# Patient Record
Sex: Female | Born: 1990 | Race: Black or African American | Hispanic: No | Marital: Single | Smoking: Current every day smoker
Health system: Southern US, Community
[De-identification: ages and names within clinical notes are randomized; demographics above are authoritative.]

---
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
## 2012-03-20 ENCOUNTER — Emergency Department: Payer: Self-pay | Admitting: Emergency Medicine

## 2012-04-10 IMAGING — US US OB < 14 WEEKS
1 series · 17 of 28 positions shown · non-contrast
Comparison: none

REASON FOR EXAM: 12.3 weeks gestation spotting/pain
COMMENTS:

[Series 1: us ob < 14 weeks · 17 of 37 slices shown]
[im 1/37]
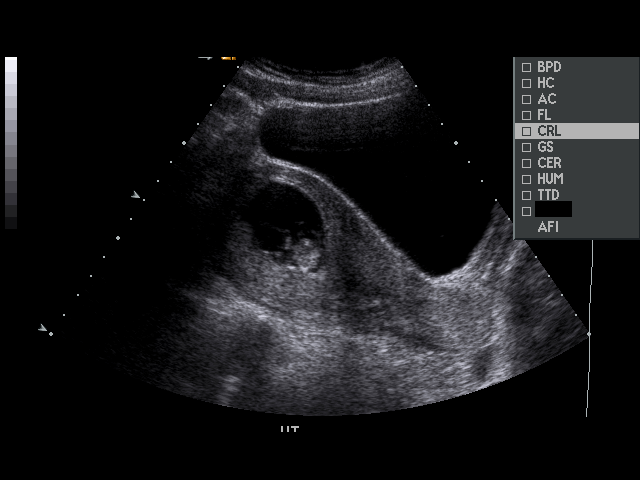
[im 3/37]
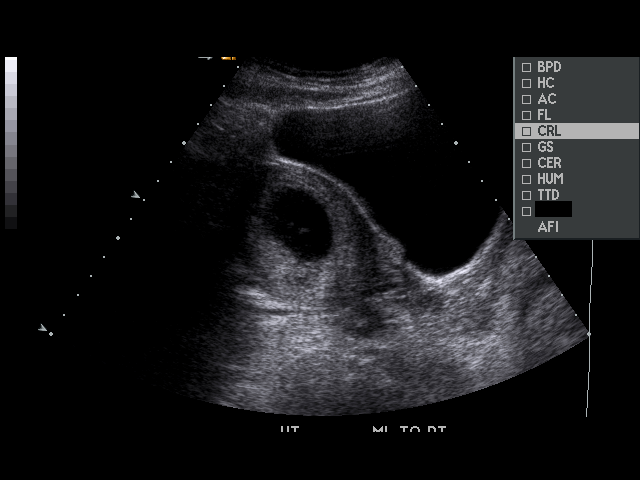
[im 6/37]
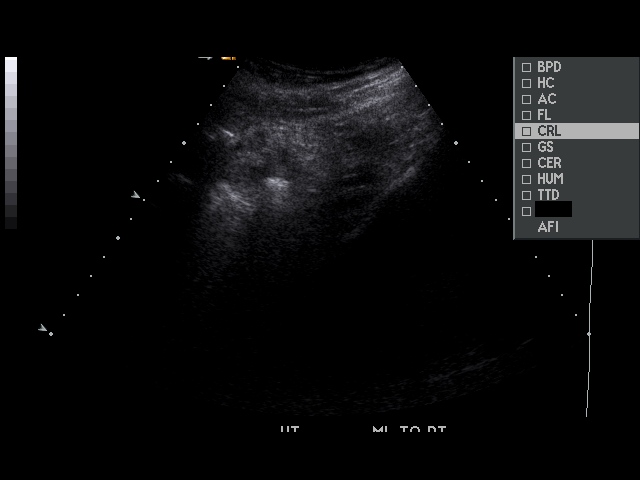
[im 7/37]
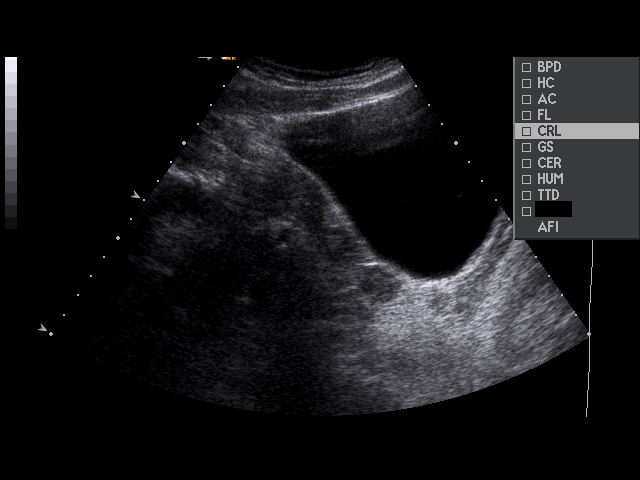
[im 10/37]
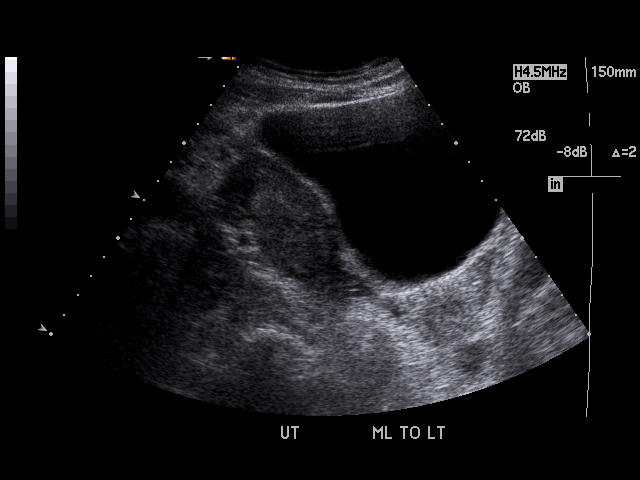
[im 13/37]
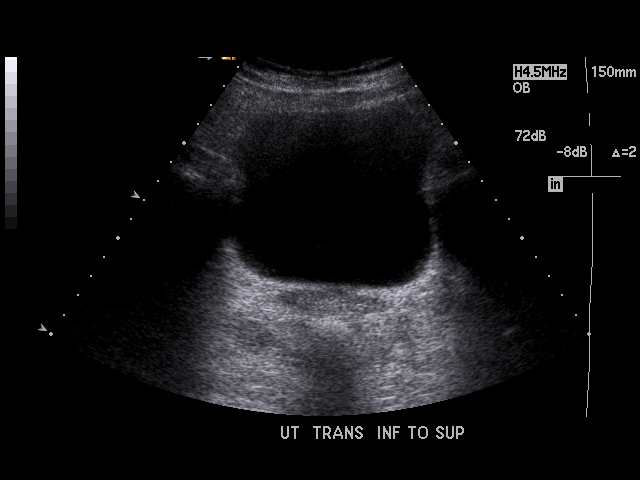
[im 14/37]
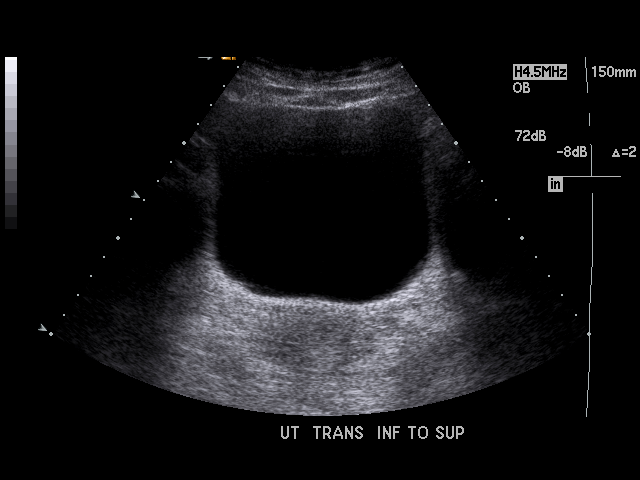
[im 17/37]
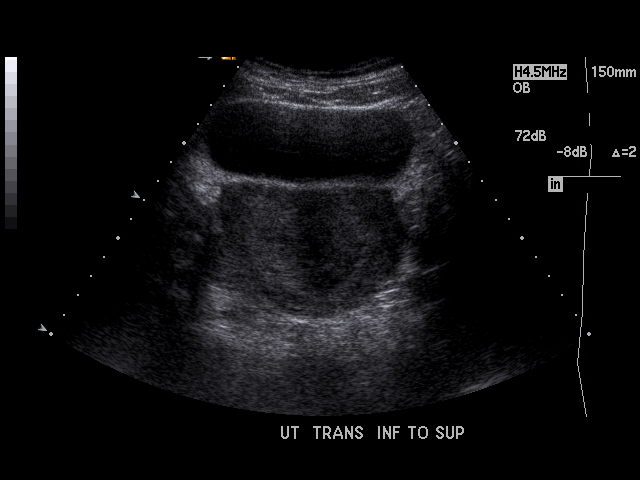
[im 19/37]
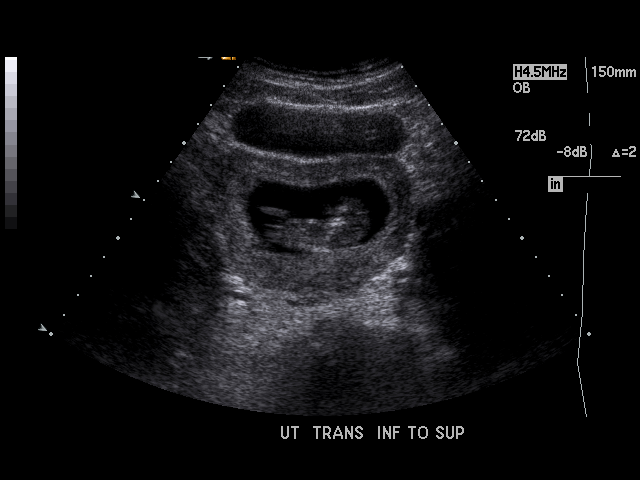
[im 21/37]
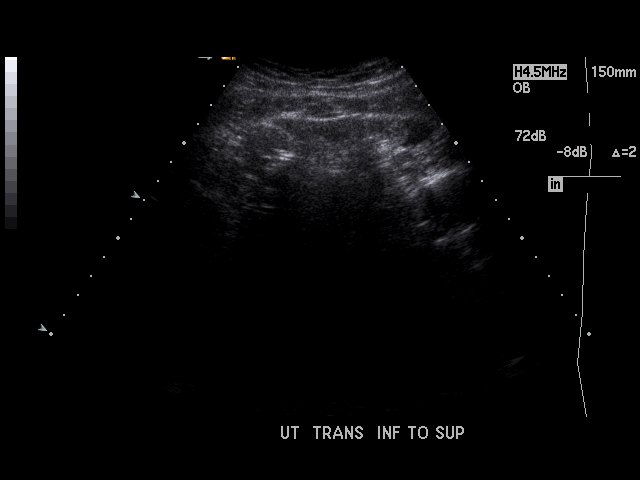
[im 23/37]
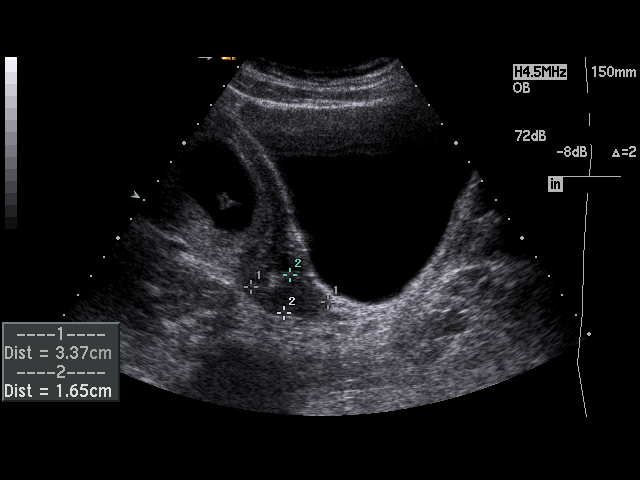
[im 25/37]
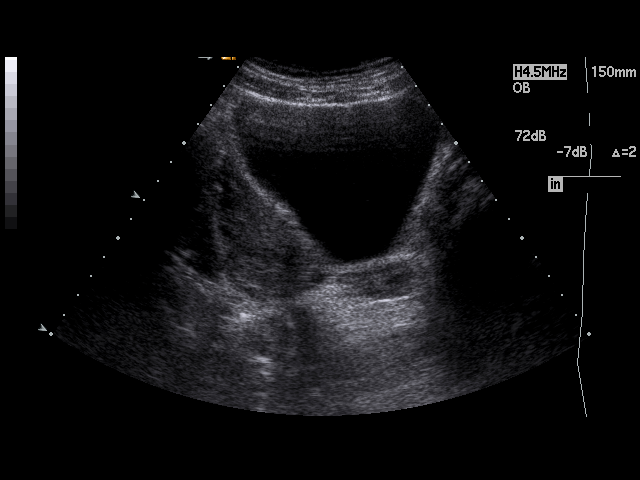
[im 27/37]
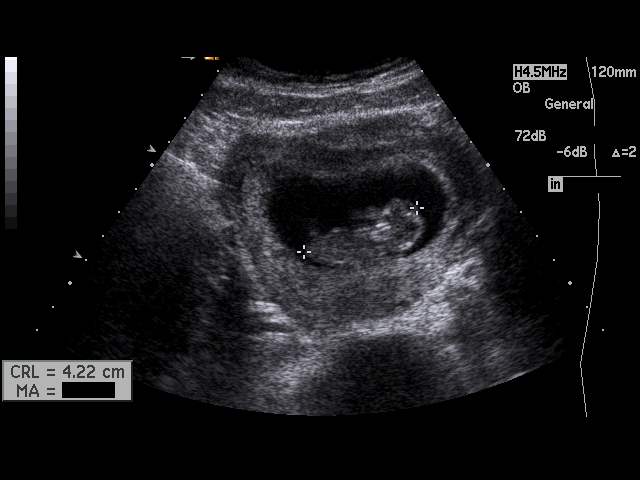
[im 30/37]
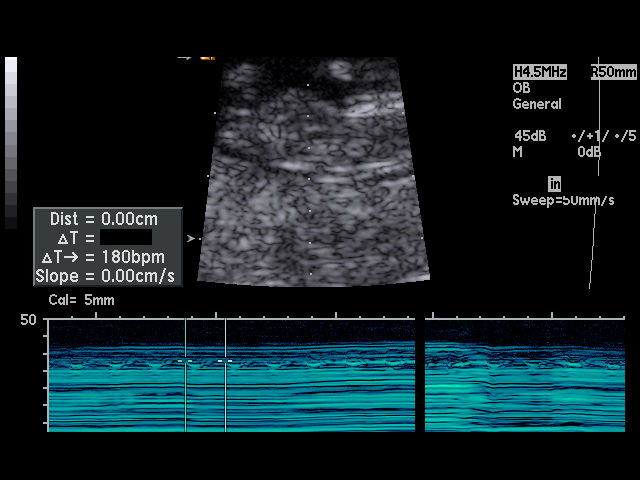
[im 31/37]
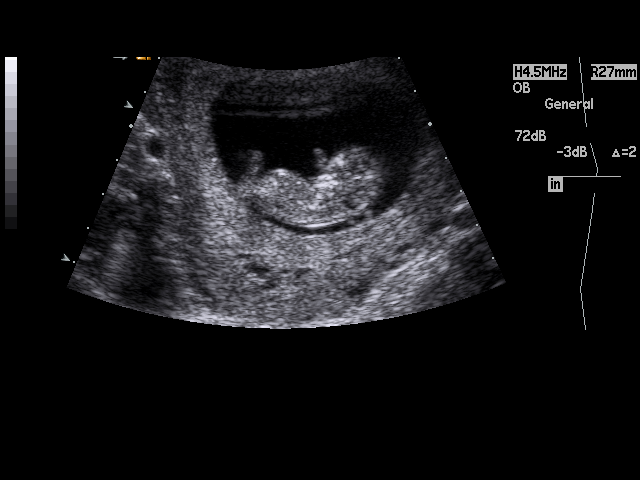
[im 34/37]
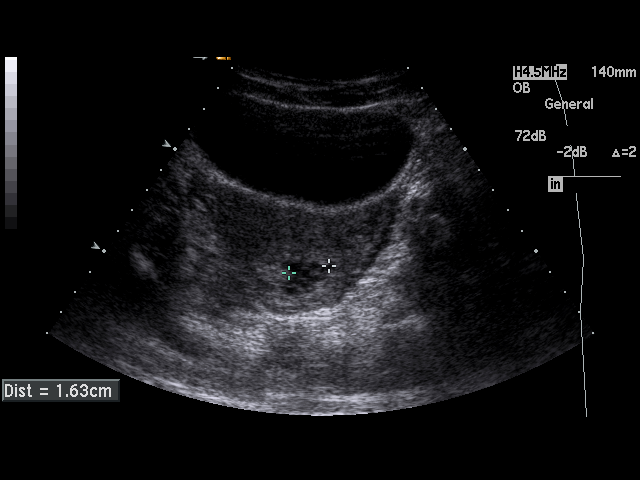
[im 37/37]
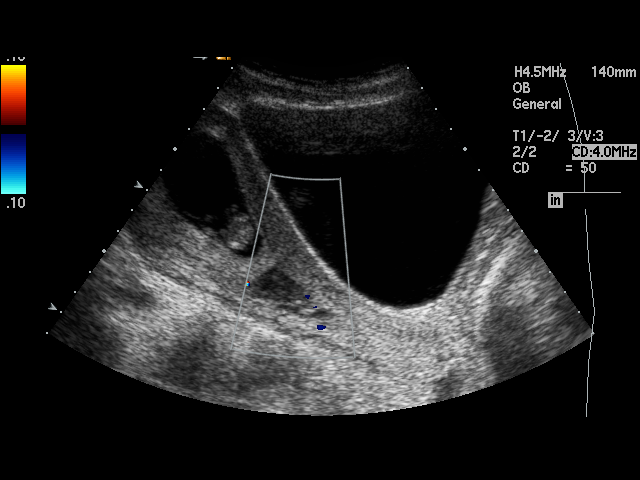

[17 of 28 positions shown; findings below may reference images not displayed]

PROCEDURE:     US  - US OB LESS THAN 14 WEEKS  - May 11, 2011  [DATE]

RESULT:     There is observed a single living intrauterine gestation. Fetal
heart rate was monitored at 184 beats per minute. There is noted a small
subchorionic bleed inferiorly. The bleed site measures approximately 2.73 cm
x 1.45 cm x 1.63 cm. No other significant abnormalities are observed. The
crown rump length measures 4.24 cm which corresponds to an estimated
menstrual age of 11 weeks 0 days. Ultrasound EDD is November 30, 2011.

The maternal ovaries are visualized bilaterally and are normal in
appearance. No abnormal adnexal masses are seen. The visualized portion of
the maternal urinary bladder is normal in appearance. No free fluid is seen
in the maternal pelvis.
IMPRESSION: 1. There is a living intrauterine gestation of approximately 11 weeks 0 days
menstrual age.
2. Ultrasound EDD is November 30, 2011.
3. There is observed a small subchorionic bleed inferiorly as noted above.

## 2012-09-28 ENCOUNTER — Emergency Department: Payer: Self-pay | Admitting: Emergency Medicine

## 2012-09-28 LAB — URINALYSIS, COMPLETE
Bacteria: NONE SEEN
Blood: NEGATIVE
Glucose,UR: NEGATIVE mg/dL (ref 0–75)
Ketone: NEGATIVE
Nitrite: NEGATIVE
Ph: 7 (ref 4.5–8.0)
Protein: NEGATIVE
RBC,UR: 4 /HPF (ref 0–5)
Specific Gravity: 1.024 (ref 1.003–1.030)
Squamous Epithelial: 11
WBC UR: 11 /HPF (ref 0–5)

## 2012-09-28 LAB — PREGNANCY, URINE: Pregnancy Test, Urine: NEGATIVE m[IU]/mL

## 2012-09-28 LAB — COMPREHENSIVE METABOLIC PANEL
Alkaline Phosphatase: 115 U/L (ref 82–169)
Anion Gap: 7 (ref 7–16)
BUN: 8 mg/dL (ref 7–18)
Co2: 24 mmol/L (ref 21–32)
Creatinine: 0.67 mg/dL (ref 0.60–1.30)
EGFR (African American): >60
EGFR (Non-African Amer.): >60
Osmolality: 275 (ref 275–301)
Potassium: 3.9 mmol/L (ref 3.5–5.1)
SGPT (ALT): 24 U/L (ref 12–78)
Total Protein: 7.9 g/dL (ref 6.4–8.6)

## 2012-09-28 LAB — CBC
HCT: 41.4 % (ref 35.0–47.0)
MCH: 31.6 pg (ref 26.0–34.0)
MCHC: 33.5 g/dL (ref 32.0–36.0)
Platelet: 300 10*3/uL (ref 150–440)
RDW: 13.7 % (ref 11.5–14.5)
WBC: 15.7 10*3/uL — ABNORMAL HIGH (ref 3.6–11.0)

## 2012-12-10 ENCOUNTER — Emergency Department: Payer: Self-pay | Admitting: Internal Medicine

## 2012-12-10 LAB — CBC
HCT: 42.2 % (ref 35.0–47.0)
MCH: 31.2 pg (ref 26.0–34.0)
MCHC: 33.6 g/dL (ref 32.0–36.0)
MCV: 93 fL (ref 80–100)
Platelet: 300 10*3/uL (ref 150–440)
RBC: 4.54 10*6/uL (ref 3.80–5.20)
WBC: 13.1 10*3/uL — ABNORMAL HIGH (ref 3.6–11.0)

## 2012-12-10 LAB — COMPREHENSIVE METABOLIC PANEL
Albumin: 4.1 g/dL (ref 3.8–5.6)
Anion Gap: 7 (ref 7–16)
Bilirubin,Total: 0.3 mg/dL (ref 0.2–1.0)
Co2: 22 mmol/L (ref 21–32)
Creatinine: 0.63 mg/dL (ref 0.60–1.30)
EGFR (African American): >60
EGFR (Non-African Amer.): >60
Osmolality: 274 (ref 275–301)
Potassium: 3.7 mmol/L (ref 3.5–5.1)
SGOT(AST): 21 U/L (ref 0–26)
SGPT (ALT): 22 U/L (ref 12–78)
Total Protein: 7.7 g/dL (ref 6.4–8.6)

## 2012-12-10 LAB — URINALYSIS, COMPLETE
Bilirubin,UR: NEGATIVE
RBC,UR: 2 /HPF (ref 0–5)
Squamous Epithelial: 25
WBC UR: 7 /HPF (ref 0–5)

## 2013-08-29 IMAGING — US ABDOMEN ULTRASOUND LIMITED
1 series · 14 of 25 positions shown · non-contrast
Comparison: none

REASON FOR EXAM: epig abd pain, intermittent
COMMENTS:   Body Site: GB and Fossa, CBD, Head of Pancreas

PROCEDURE:     US  - US ABDOMEN LIMITED SURVEY  - September 28, 2012  [DATE]
RESULT:     Right upper quadrant abdominal ultrasound dated 09/28/2012
TECHNIQUE: Real-time sonographic imaging of the right quadrant was obtained.
Representative static imaging was provided for interpretation.

[Series 1: abdomen ultrasound limited · 0.23mm/px · 14 of 50 slices shown]
[im 1/50]
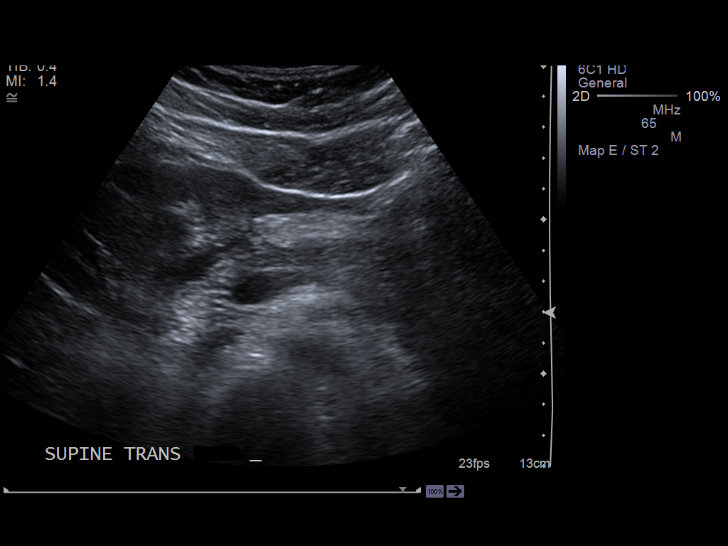
[im 5/50]
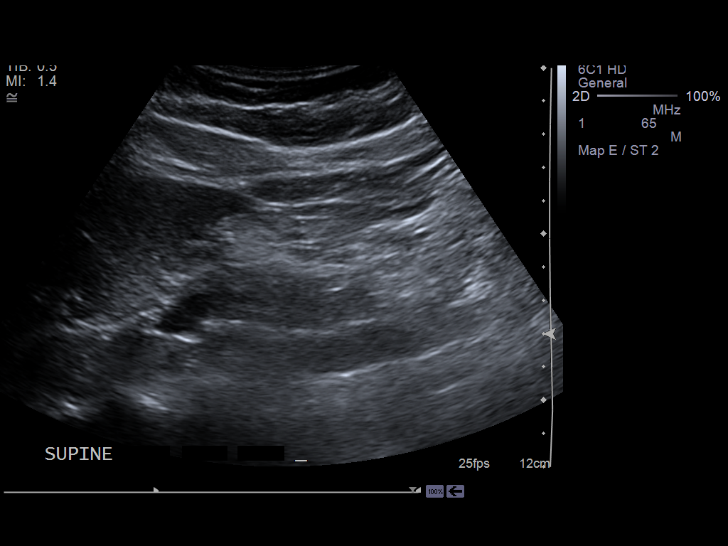
[im 9/50]
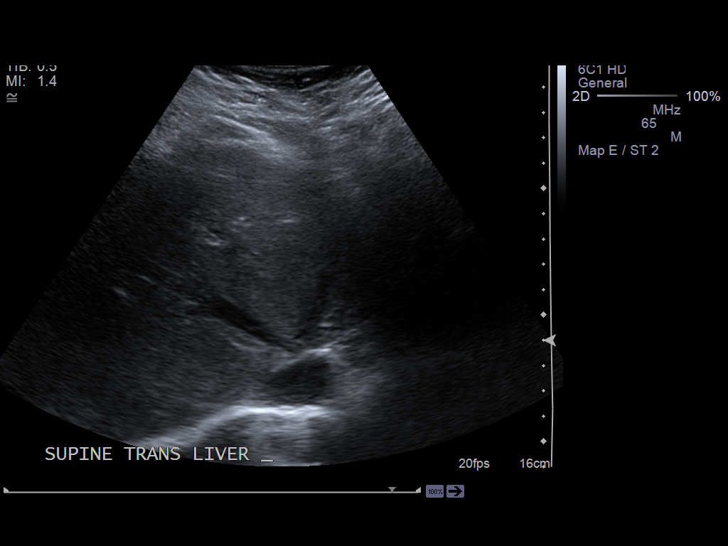
[im 13/50]
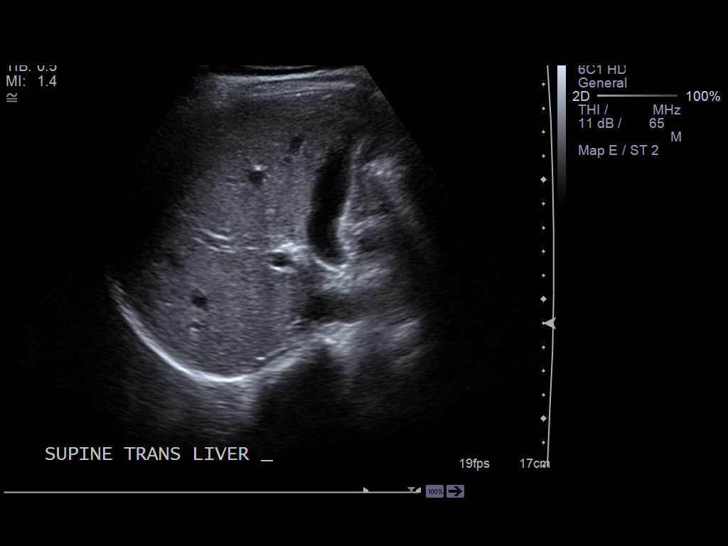
[im 17/50]
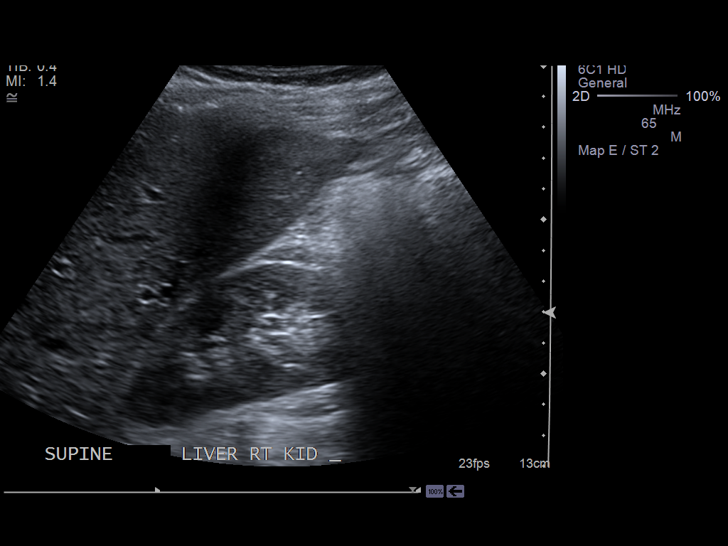
[im 19/50]
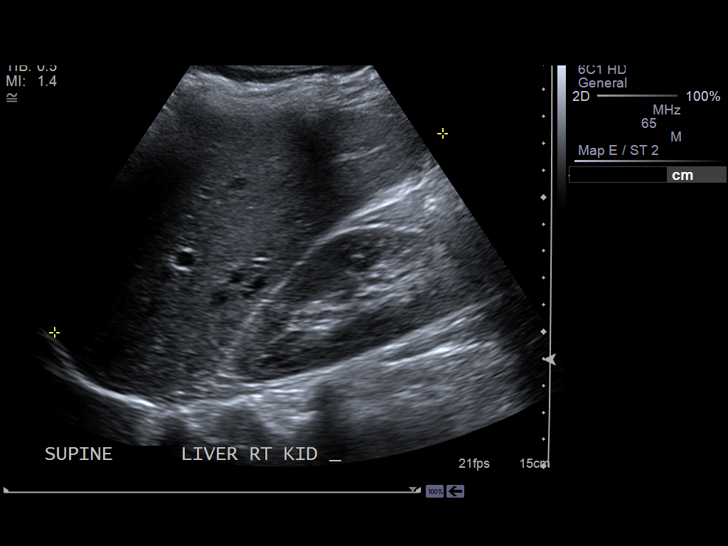
[im 23/50]
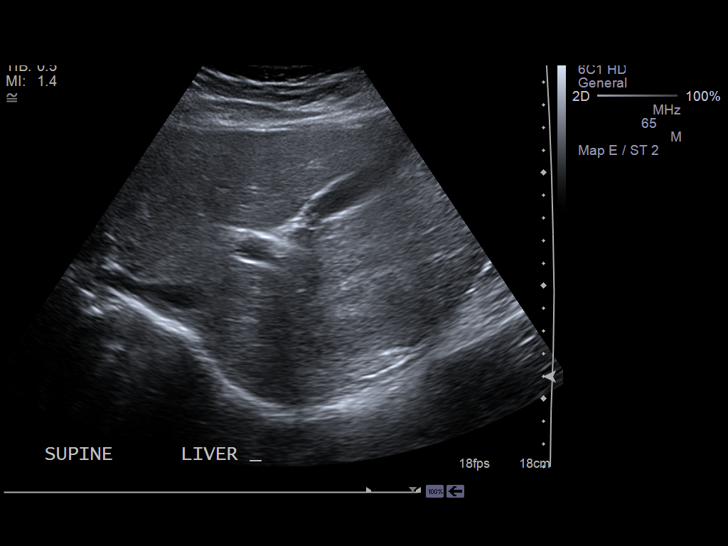
[im 27/50]
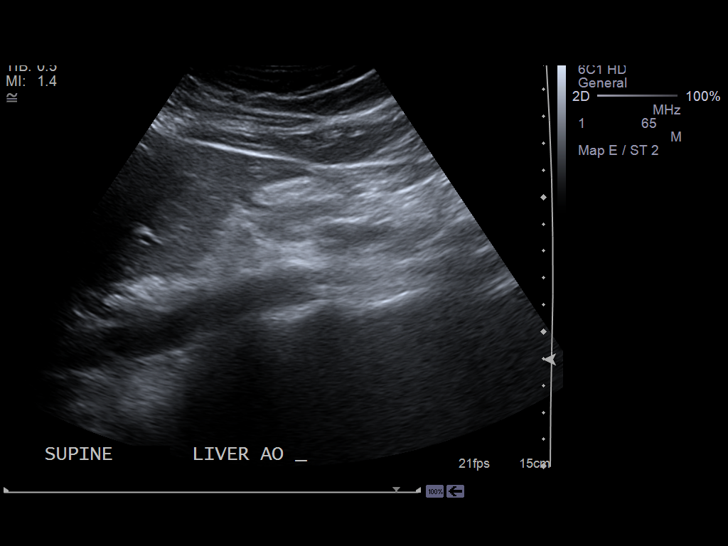
[im 31/50]
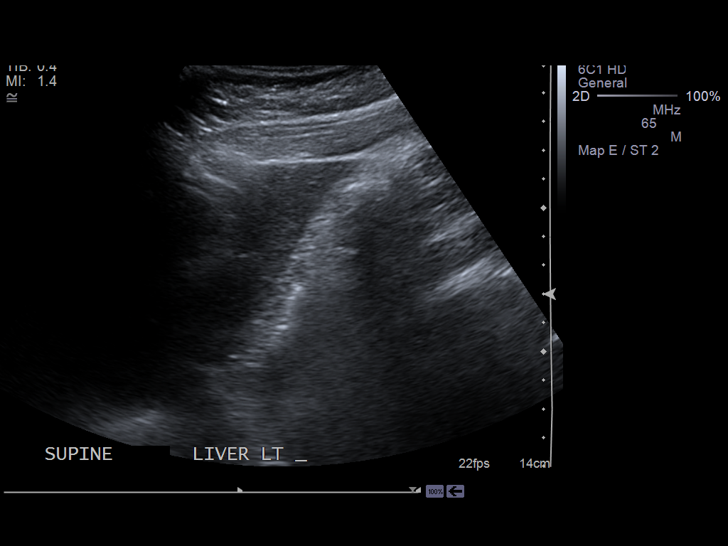
[im 33/50]
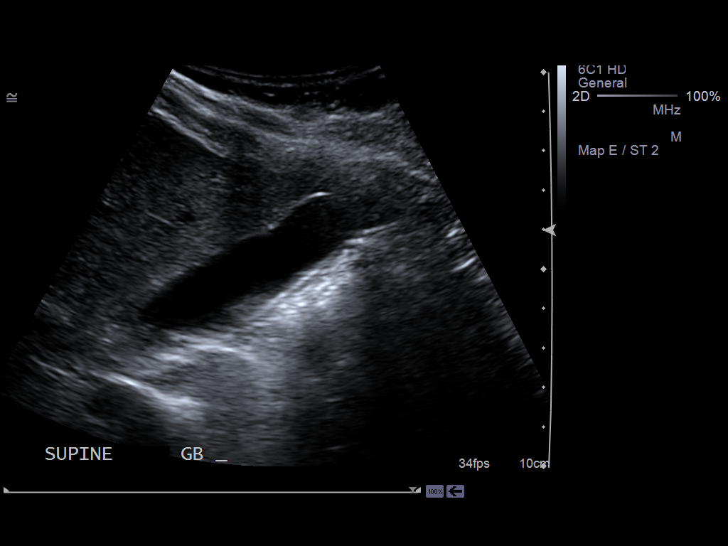
[im 37/50]
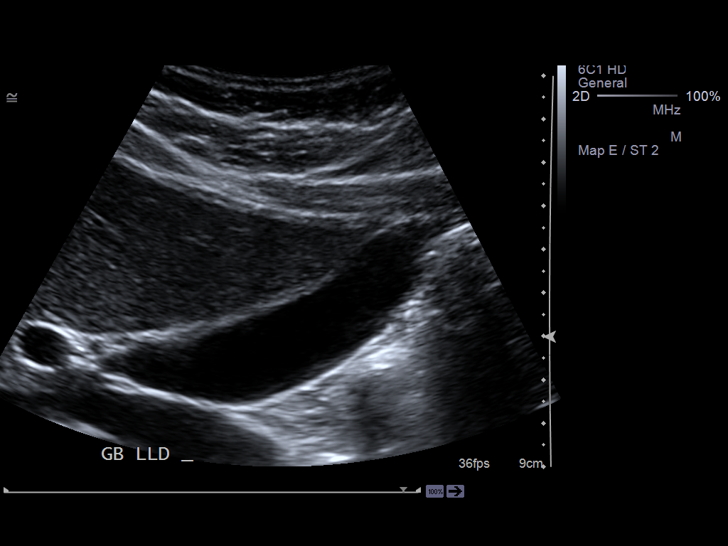
[im 41/50]
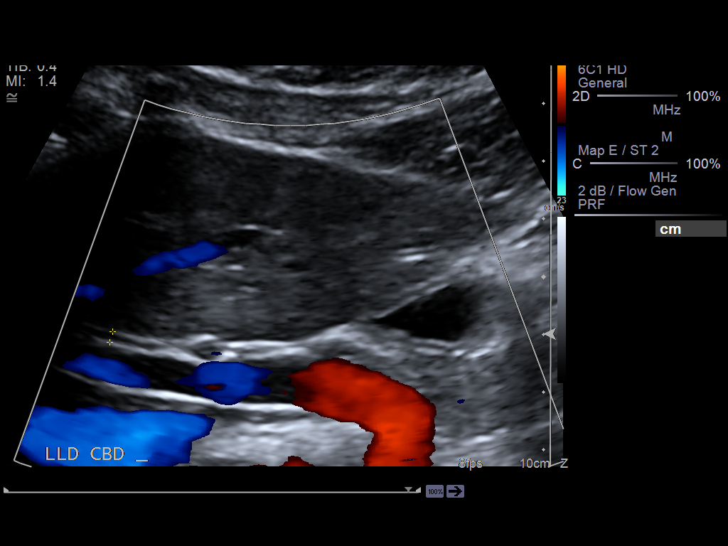
[im 45/50]
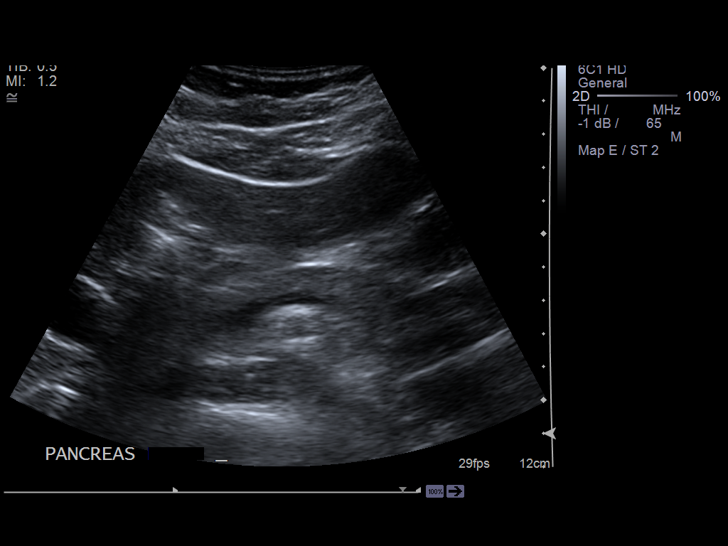
[im 50/50]
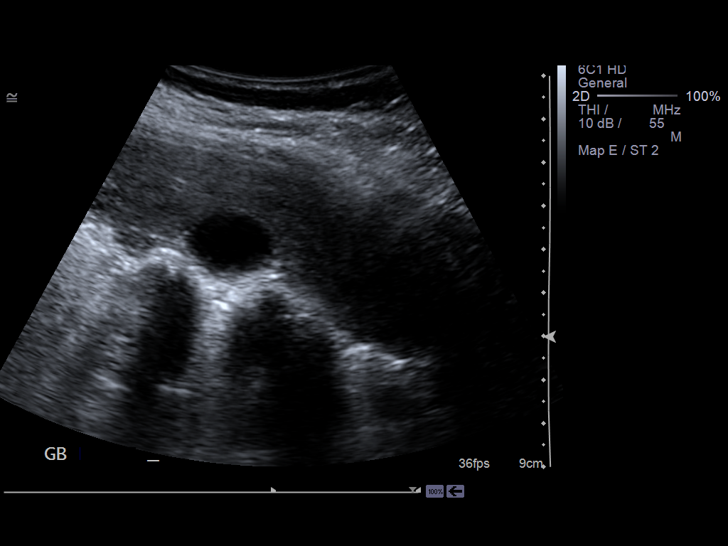

[14 of 25 positions shown; findings below may reference images not displayed]

FINDINGS: Limited evaluation of the liver is unremarkable.

There is no evidence of pericholecystic fluid, gallstones, sludging,
gallbladder wall thickening, nor intra or extrahepatic biliary ductal
dilatation. The patient did not demonstrate a sonographic Murphy's sign. The
common bile duct measures 1.9 mm in diameter the gallbladder wall thickness
is 1.6 mm. The visualized portion of the pancreas unremarkable. Hepato-
pedal flow is identified within the portal vein.
IMPRESSION: Unremarkable right upper quadrant abdominal ultrasound.

## 2013-09-04 ENCOUNTER — Emergency Department: Payer: Self-pay | Admitting: Emergency Medicine

## 2013-09-04 LAB — RAPID INFLUENZA A&B ANTIGENS

## 2014-01-16 ENCOUNTER — Emergency Department: Payer: Self-pay | Admitting: Emergency Medicine

## 2014-01-16 LAB — CBC
HCT: 39.5 % (ref 35.0–47.0)
HGB: 13.1 g/dL (ref 12.0–16.0)
MCH: 31.3 pg (ref 26.0–34.0)
MCHC: 33.2 g/dL (ref 32.0–36.0)
MCV: 94 fL (ref 80–100)
PLATELETS: 340 10*3/uL (ref 150–440)
RBC: 4.19 10*6/uL (ref 3.80–5.20)
RDW: 13.5 % (ref 11.5–14.5)
WBC: 8.2 10*3/uL (ref 3.6–11.0)

## 2014-01-16 LAB — HCG, QUANTITATIVE, PREGNANCY: Beta Hcg, Quant.: 162 m[IU]/mL — ABNORMAL HIGH

## 2014-01-25 ENCOUNTER — Emergency Department: Payer: Self-pay | Admitting: Emergency Medicine

## 2014-01-26 LAB — CBC
HCT: 39 % (ref 35.0–47.0)
HGB: 13.1 g/dL (ref 12.0–16.0)
MCH: 31.9 pg (ref 26.0–34.0)
MCHC: 33.6 g/dL (ref 32.0–36.0)
MCV: 95 fL (ref 80–100)
PLATELETS: 303 10*3/uL (ref 150–440)
RBC: 4.1 10*6/uL (ref 3.80–5.20)
RDW: 13.6 % (ref 11.5–14.5)
WBC: 7.9 10*3/uL (ref 3.6–11.0)

## 2014-01-26 LAB — HCG, QUANTITATIVE, PREGNANCY: Beta Hcg, Quant.: 422 m[IU]/mL — ABNORMAL HIGH

## 2014-10-07 ENCOUNTER — Emergency Department: Payer: Self-pay | Admitting: Emergency Medicine

## 2014-10-07 LAB — LIPASE, BLOOD: LIPASE: 109 U/L (ref 73–393)

## 2014-10-07 LAB — URINALYSIS, COMPLETE
BILIRUBIN, UR: NEGATIVE
Blood: NEGATIVE
GLUCOSE, UR: NEGATIVE mg/dL (ref 0–75)
Ketone: NEGATIVE
LEUKOCYTE ESTERASE: NEGATIVE
Nitrite: NEGATIVE
PH: 5 (ref 4.5–8.0)
Protein: 30
Specific Gravity: 1.03 (ref 1.003–1.030)
Squamous Epithelial: 18
WBC UR: 3 /HPF (ref 0–5)

## 2014-10-07 LAB — CBC
HCT: 37.7 % (ref 35.0–47.0)
HGB: 12.7 g/dL (ref 12.0–16.0)
MCH: 30.9 pg (ref 26.0–34.0)
MCHC: 33.5 g/dL (ref 32.0–36.0)
MCV: 92 fL (ref 80–100)
Platelet: 287 10*3/uL (ref 150–440)
RBC: 4.09 10*6/uL (ref 3.80–5.20)
RDW: 13.3 % (ref 11.5–14.5)
WBC: 5.3 10*3/uL (ref 3.6–11.0)

## 2014-10-07 LAB — COMPREHENSIVE METABOLIC PANEL
ALK PHOS: 90 U/L (ref 46–116)
Albumin: 3.6 g/dL (ref 3.4–5.0)
Anion Gap: 7 (ref 7–16)
BILIRUBIN TOTAL: 0.2 mg/dL (ref 0.2–1.0)
BUN: 9 mg/dL (ref 7–18)
CALCIUM: 8.8 mg/dL (ref 8.5–10.1)
CREATININE: 0.91 mg/dL (ref 0.60–1.30)
Chloride: 106 mmol/L (ref 98–107)
Co2: 26 mmol/L (ref 21–32)
EGFR (African American): >60
EGFR (Non-African Amer.): >60
GLUCOSE: 96 mg/dL (ref 65–99)
Osmolality: 276 (ref 275–301)
Potassium: 4 mmol/L (ref 3.5–5.1)
SGOT(AST): 21 U/L (ref 15–37)
SGPT (ALT): 21 U/L (ref 14–63)
SODIUM: 139 mmol/L (ref 136–145)
TOTAL PROTEIN: 7.4 g/dL (ref 6.4–8.2)

## 2014-10-07 LAB — GC/CHLAMYDIA PROBE AMP

## 2014-10-07 LAB — WET PREP, GENITAL

## 2014-11-15 ENCOUNTER — Emergency Department: Payer: Self-pay | Admitting: Emergency Medicine

## 2014-11-16 ENCOUNTER — Emergency Department: Payer: Self-pay | Admitting: Emergency Medicine

## 2015-03-09 ENCOUNTER — Emergency Department
Admission: EM | Admit: 2015-03-09 | Discharge: 2015-03-09 | Disposition: A | Discharge status: Home / Self Care | Payer: Medicaid Other | Attending: Emergency Medicine | Admitting: Emergency Medicine

## 2015-03-09 ENCOUNTER — Encounter: Payer: Self-pay | Admitting: Emergency Medicine

## 2015-03-09 ENCOUNTER — Emergency Department: Payer: Medicaid Other

## 2015-03-09 DIAGNOSIS — F17210 Nicotine dependence, cigarettes, uncomplicated: Secondary | ICD-10-CM | POA: Insufficient documentation

## 2015-03-09 DIAGNOSIS — R109 Unspecified abdominal pain: Secondary | ICD-10-CM | POA: Insufficient documentation

## 2015-03-09 DIAGNOSIS — Z3A01 Less than 8 weeks gestation of pregnancy: Secondary | ICD-10-CM | POA: Diagnosis not present

## 2015-03-09 DIAGNOSIS — O200 Threatened abortion: Secondary | ICD-10-CM | POA: Diagnosis not present

## 2015-03-09 DIAGNOSIS — O99331 Smoking (tobacco) complicating pregnancy, first trimester: Secondary | ICD-10-CM | POA: Diagnosis not present

## 2015-03-09 DIAGNOSIS — O9989 Other specified diseases and conditions complicating pregnancy, childbirth and the puerperium: Secondary | ICD-10-CM | POA: Diagnosis present

## 2015-03-09 LAB — URINALYSIS COMPLETE WITH MICROSCOPIC (ARMC ONLY)
Bilirubin Urine: NEGATIVE
Glucose, UA: NEGATIVE mg/dL
KETONES UR: NEGATIVE mg/dL
Leukocytes, UA: NEGATIVE
Nitrite: NEGATIVE
PROTEIN: 30 mg/dL — AB
Specific Gravity, Urine: 1.025 (ref 1.005–1.030)
pH: 8 (ref 5.0–8.0)

## 2015-03-09 LAB — ABO/RH: ABO/RH(D): O POS

## 2015-03-09 LAB — TYPE AND SCREEN
ABO/RH(D): O POS
ANTIBODY SCREEN: NEGATIVE

## 2015-03-09 LAB — COMPREHENSIVE METABOLIC PANEL
ALT: 16 U/L (ref 14–54)
AST: 20 U/L (ref 15–41)
Albumin: 4 g/dL (ref 3.5–5.0)
Alkaline Phosphatase: 71 U/L (ref 38–126)
Anion gap: 7 (ref 5–15)
BUN: 6 mg/dL (ref 6–20)
CO2: 26 mmol/L (ref 22–32)
Calcium: 8.9 mg/dL (ref 8.9–10.3)
Chloride: 105 mmol/L (ref 101–111)
Creatinine, Ser: 0.82 mg/dL (ref 0.44–1.00)
GFR calc Af Amer: >60 mL/min (ref >60)
Glucose, Bld: 110 mg/dL — ABNORMAL HIGH (ref 65–99)
POTASSIUM: 3.8 mmol/L (ref 3.5–5.1)
Sodium: 138 mmol/L (ref 135–145)
Total Bilirubin: 0.2 mg/dL — ABNORMAL LOW (ref 0.3–1.2)
Total Protein: 7.5 g/dL (ref 6.5–8.1)

## 2015-03-09 LAB — POCT PREGNANCY, URINE: Preg Test, Ur: POSITIVE — AB

## 2015-03-09 LAB — HCG, QUANTITATIVE, PREGNANCY: hCG, Beta Chain, Quant, S: 196 m[IU]/mL — ABNORMAL HIGH (ref <5)

## 2015-03-09 MED ORDER — ACETAMINOPHEN 325 MG PO TABS
ORAL_TABLET | ORAL | Status: AC
  Administered 2015-03-09: 650 mg via ORAL
  Filled 2015-03-09: qty 2

## 2015-03-09 MED ORDER — ACETAMINOPHEN 325 MG PO TABS
650.0000 mg | ORAL_TABLET | Freq: Once | ORAL | Status: AC
  Administered 2015-03-09: 650 mg via ORAL

## 2015-03-09 NOTE — ED Notes (Signed)
Patient c/o vaginal spotting and right lower quadrant pain. States pain feels like previous miscarriages

## 2015-03-09 NOTE — ED Provider Notes (Signed)
[facility] Emergency Department Provider Note  ____________________________________________  Time seen: On arrival  I have reviewed the triage vital signs and the nursing notes.   HISTORY  Chief Complaint Abdominal Pain    HPI Lisa Ho is a 24 y.o. female who complains of mild right lower quadrant pelvic discomfort and vaginal spotting which she says is consistent with miscarriage although she does not know if she is pregnant or not. 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 She reports she has been trying to get pregnant but her last period was at the end of June. No nausea no vomiting. No fever no chills. No dysuria.     History reviewed. No pertinent past medical history.  There are no active problems to display for this patient.   History reviewed. No pertinent past surgical history.  No current outpatient prescriptions on file.  Allergies Apple and Morphine and related  No family history on file.  Social History History  Substance Use Topics  . Smoking status: Current Ho Day Smoker  . Smokeless tobacco: Not on file  . Alcohol Use: Yes     Comment: occasional    Review of Systems  Constitutional: Negative for fever. Eyes: Negative for visual changes. ENT: Negative for sore throat Cardiovascular: Negative for chest pain. Respiratory: Negative for shortness of breath. Gastrointestinal: Negative for vomiting and diarrhea. Genitourinary: Negative for dysuria. Positive for vaginal spotting Musculoskeletal: Negative for back pain. Skin: Negative for rash. Neurological: Negative for headaches or focal weakness Psychiatric: No anxiety  10-point ROS otherwise negative.  ____________________________________________   PHYSICAL EXAM:  VITAL SIGNS: ED Triage Vitals  Enc Vitals Group     BP 03/09/15 1536 115/66 mmHg     Pulse Rate 03/09/15 1536 81     Resp 03/09/15 1536 16     Temp 03/09/15 1536  98.2 F (36.8 C)     Temp Source 03/09/15 1536 Oral     SpO2 03/09/15 1536 98 %     Weight 03/09/15 1536 167 lb (75.751 kg)     Height 03/09/15 1536 5\' 1"  (1.549 m)     Head Cir --      Peak Flow --      Pain Score 03/09/15 1537 9     Pain Loc --      Pain Edu? --      Excl. in GC? --      Constitutional: Alert and oriented. Well appearing and in no distress. Eyes: Conjunctivae are normal.  ENT   Head: Normocephalic and atraumatic.   Mouth/Throat: Mucous membranes are moist. Cardiovascular: Normal rate, regular rhythm.  Respiratory: Normal respiratory effort without tachypnea nor retractions.  Gastrointestinal: Soft and non-tender in all quadrants. No distention. There is no CVA tenderness. Genitourinary: deferred Musculoskeletal: Nontender with normal range of motion in all extremities. No lower extremity tenderness nor edema. Neurologic:  Normal speech and language. No gross focal neurologic deficits are appreciated. Skin:  Skin is warm, dry and intact. No rash noted. Psychiatric: Mood and affect are normal. Patient exhibits appropriate insight and judgment.  ____________________________________________    LABS (pertinent positives/negatives)  Labs Reviewed  COMPREHENSIVE METABOLIC PANEL - Abnormal; Notable for the following:    Glucose, Bld 110 (*)    Total Bilirubin 0.2 (*)    All other components within normal limits  URINALYSIS COMPLETEWITH MICROSCOPIC ([facility] ONLY) - Abnormal; Notable for the following:    Color, Urine YELLOW (*)  APPearance HAZY (*)    Hgb urine dipstick 3+ (*)    Protein, ur 30 (*)    Bacteria, UA RARE (*)    Squamous Epithelial / LPF 6-30 (*)    All other components within normal limits  POCT PREGNANCY, URINE - Abnormal; Notable for the following:    Preg Test, Ur POSITIVE (*)    All other components within normal limits  CBC WITH DIFFERENTIAL/PLATELET  HCG, QUANTITATIVE, PREGNANCY  POC URINE PREG, ED  ABO/RH  TYPE AND SCREEN     ____________________________________________   EKG  None  ____________________________________________    RADIOLOGY I have personally reviewed any xrays that were ordered on this patient: [place] pending  ____________________________________________   PROCEDURES  Procedure(s) performed: none  Critical Care performed: none  ____________________________________________   INITIAL IMPRESSION / ASSESSMENT AND PLAN / ED COURSE  Pertinent labs & imaging results that were available during my care of the patient were reviewed by me and considered in my medical decision making (see chart for details).  Patient with positive urine pregnancy test but reports last period was at the end of June. Patient is O+ she will not require rhogam. HCG pending prior to [place].  ----------------------------------------- 5:20 PM on 03/09/2015 -----------------------------------------   Signed out to Dr. York Cerise to follow up US  ____________________________________________   FINAL CLINICAL IMPRESSION(S) / ED DIAGNOSES  Final diagnoses:  Threatened miscarriage     Lisa Every, MD 03/09/15 [id number]

## 2015-03-09 NOTE — Discharge Instructions (Signed)
You have been seen in the Emergency Department (ED) for vaginal bleeding during pregnancy, which is called a threatened abortion.  Your ultrasound did not identify a fetus, either because it is too early, or because it is not developing normally.  Either way, you need to follow up for repeat lab work (a repeat HCG) and re-evaluation.  Please start taking prenatal vitamins (over the counter) if you are not already doing so.  As a result of your blood type, you did not receive an injection of medication called Rhogam - please let your OB/Gyn know.  Please follow up as recommended above.  If you develop any other symptoms that concern you (including, but not limited to, persistent vomiting, worsening bleeding, abdominal or pelvic pain, or fever greater than 101), please return immediately to the Emergency Department.   Threatened Miscarriage A threatened miscarriage occurs when you have vaginal bleeding during your first 20 weeks of pregnancy but the pregnancy has not ended. If you have vaginal bleeding during this time, your health care provider will do tests to make sure you are still pregnant. If the tests show you are still pregnant and the developing baby (fetus) inside your womb (uterus) is still growing, your condition is considered a threatened miscarriage. A threatened miscarriage does not mean your pregnancy will end, but it does increase the risk of losing your pregnancy (complete miscarriage). CAUSES  The cause of a threatened miscarriage is usually not known. If you go on to have a complete miscarriage, the most common cause is an abnormal number of chromosomes in the developing baby. Chromosomes are the structures inside cells that hold all your genetic material. Some causes of vaginal bleeding that do not result in miscarriage include:  Having sex.  Having an infection.  Normal hormone changes of pregnancy.  Bleeding that occurs when an egg implants in your uterus. RISK  FACTORS Risk factors for bleeding in early pregnancy include:  Obesity.  Smoking.  Drinking excessive amounts of alcohol or caffeine.  Recreational drug use. SIGNS AND SYMPTOMS  Light vaginal bleeding.  Mild abdominal pain or cramps. DIAGNOSIS  If you have bleeding with or without abdominal pain before 20 weeks of pregnancy, your health care provider will do tests to check whether you are still pregnant. One important test involves using sound waves and a computer (ultrasound) to create images of the inside of your uterus. Other tests include an internal exam of your vagina and uterus (pelvic exam) and measurement of your baby's heart rate.  You may be diagnosed with a threatened miscarriage if:  Ultrasound testing shows you are still pregnant.  Your baby's heart rate is strong.  A pelvic exam shows that the opening between your uterus and your vagina (cervix) is closed.  Your heart rate and blood pressure are stable.  Blood tests confirm you are still pregnant. TREATMENT  No treatments have been shown to prevent a threatened miscarriage from going on to a complete miscarriage. However, the right home care is important.  HOME CARE INSTRUCTIONS   Make sure you keep all your appointments for prenatal care. This is very important.  Get plenty of rest.  Do not have sex or use tampons if you have vaginal bleeding.  Do not douche.  Do not smoke or use recreational drugs.  Do not drink alcohol.  Avoid caffeine. SEEK MEDICAL CARE IF:  You have light vaginal bleeding or spotting while pregnant.  You have abdominal pain or cramping.  You have a fever. SEEK  IMMEDIATE MEDICAL CARE IF:  You have heavy vaginal bleeding.  You have blood clots coming from your vagina.  You have severe low back pain or abdominal cramps.  You have fever, chills, and severe abdominal pain. MAKE SURE YOU:  Understand these instructions.  Will watch your condition.  Will get help right  away if you are not doing well or get worse. Document Released: 08/17/2005 Document Revised: 08/22/2013 Document Reviewed: 06/13/2013 [facility] Patient Information 2015 [place], [place]. This information is not intended to replace advice given to you by your health care provider. Make sure you discuss any questions you have with your health care provider.

## 2015-03-09 NOTE — ED Provider Notes (Signed)
-----------------------------------------   7:29 PM on 03/09/2015 -----------------------------------------   (Note that documentation was delayed due to multiple ED patients requiring immediate care.)   [place] Ob Comp Less 14 Wks  03/09/2015   CLINICAL DATA:  24 year old pregnant female with vaginal bleeding for 2 days. Estimated gestational age of [redacted] weeks 0 days by LMP. Beta HCG of 196.  EXAM: OBSTETRIC <14 WK US AND TRANSVAGINAL OB US  TECHNIQUE: Both transabdominal and transvaginal ultrasound examinations were performed for complete evaluation of the gestation as well as the maternal uterus, adnexal regions, and pelvic cul-de-sac. Transvaginal technique was performed to assess early pregnancy.  COMPARISON:  None.  FINDINGS: Intrauterine gestational sac: Not visualized  Yolk sac:  Not visualized  Embryo:  Not visualized  Maternal uterus/adnexae: The uterus measures 4.8 x 3.7 x 3.4 cm. No focal uterine lesions are identified.  A 3.1 x 3.2 x 2.6 cm hemorrhagic cyst within the right ovary is noted.  A 4 x 3.3 x 3.2 cm hemorrhagic cyst within the left ovary is noted.  There is no evidence of free fluid or other adnexal abnormality.  IMPRESSION: No evidence of intrauterine pregnancy or adnexal mass visualized. Differential diagnosis includes recent spontaneous abortion, IUP too early to visualize, and occult ectopic pregnancy. Recommend close follow up of quantitative B-HCG levels, and follow up US as clinically warranted.  Bilateral ovarian hemorrhagic cysts, measuring 3.13.2 x 2.6 cm on the right and 4 x 3.3 x 3.2 cm on the left. No evidence of free fluid.   Electronically Signed   By: Harmon PierJeffrey  Hu M.D.   On: 03/09/2015 18:13   [place] Ob Transvaginal  03/09/2015   CLINICAL DATA:  24 year old pregnant female with vaginal bleeding for 2 days. Estimated gestational age of [redacted] weeks 0 days by LMP. Beta HCG of 196.  EXAM: OBSTETRIC <14 WK US AND TRANSVAGINAL OB US  TECHNIQUE: Both transabdominal and transvaginal ultrasound  examinations were performed for complete evaluation of the gestation as well as the maternal uterus, adnexal regions, and pelvic cul-de-sac. Transvaginal technique was performed to assess early pregnancy.  COMPARISON:  None.  FINDINGS: Intrauterine gestational sac: Not visualized  Yolk sac:  Not visualized  Embryo:  Not visualized  Maternal uterus/adnexae: The uterus measures 4.8 x 3.7 x 3.4 cm. No focal uterine lesions are identified.  A 3.1 x 3.2 x 2.6 cm hemorrhagic cyst within the right ovary is noted.  A 4 x 3.3 x 3.2 cm hemorrhagic cyst within the left ovary is noted.  There is no evidence of free fluid or other adnexal abnormality.  IMPRESSION: No evidence of intrauterine pregnancy or adnexal mass visualized. Differential diagnosis includes recent spontaneous abortion, IUP too early to visualize, and occult ectopic pregnancy. Recommend close follow up of quantitative B-HCG levels, and follow up US as clinically warranted.  Bilateral ovarian hemorrhagic cysts, measuring 3.13.2 x 2.6 cm on the right and 4 x 3.3 x 3.2 cm on the left. No evidence of free fluid.   Electronically Signed   By: Harmon PierJeffrey  Hu M.D.   On: 03/09/2015 18:13     I discussed the ultrasound findings with the patient.  Her pain is well controlled.  She is going to follow up with [facility] on Monday (48 hours).  I gave her my usual and customary return precautions.  Loleta Roseory Zakai Gonyea, MD 03/09/15 [phone]

## 2015-10-16 IMAGING — US US OB < 14 WEEKS - US OB TV
1 series · 14 of 28 positions shown · non-contrast
Comparison: None.

CLINICAL DATA: Left-sided pain.  Beta HCG 1,015

EXAM:
OBSTETRIC <14 WK ULTRASOUND
TECHNIQUE: Transabdominal ultrasound was performed for evaluation of the
gestation as well as the maternal uterus and adnexal regions.

[Series 1: us ob < 14 weeks - us ob tv · 0.20mm/px · 14 of 59 slices shown]
[im 3/59]
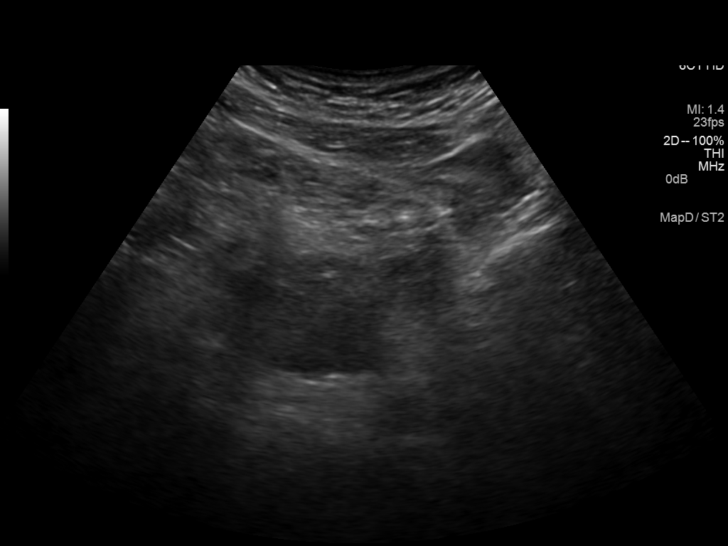
[im 7/59]
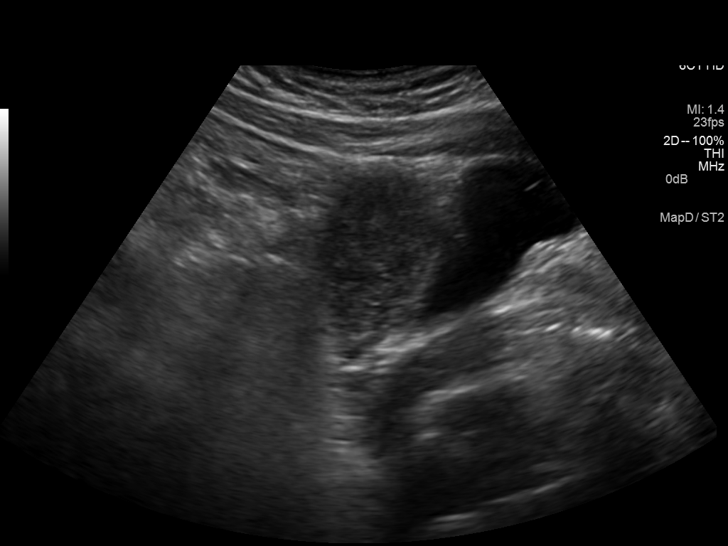
[im 11/59]
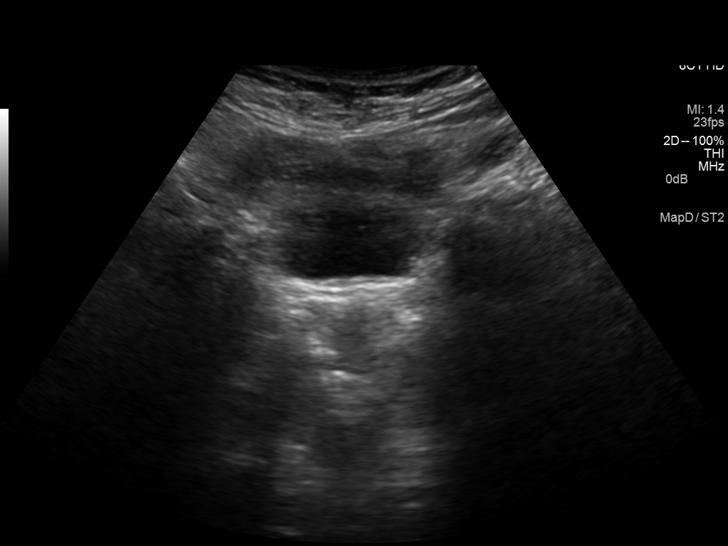
[im 16/59]
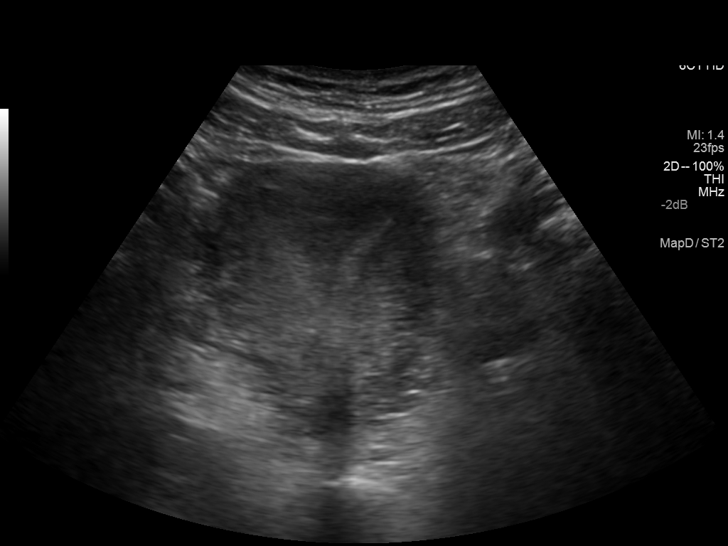
[im 20/59]
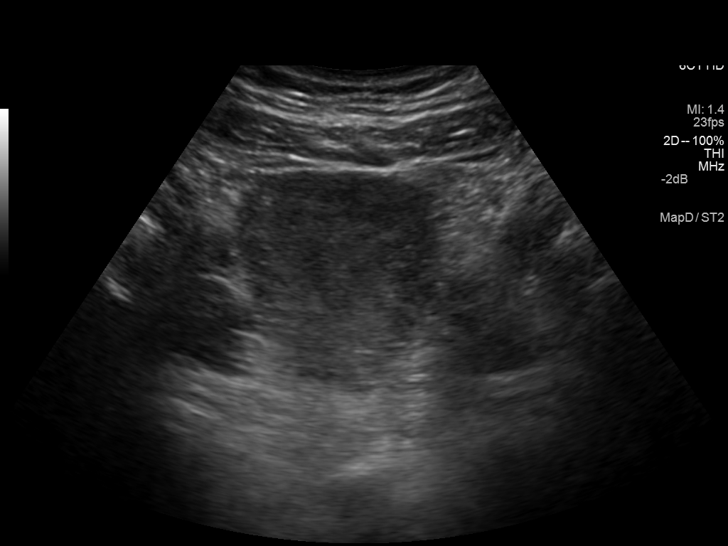
[im 24/59]
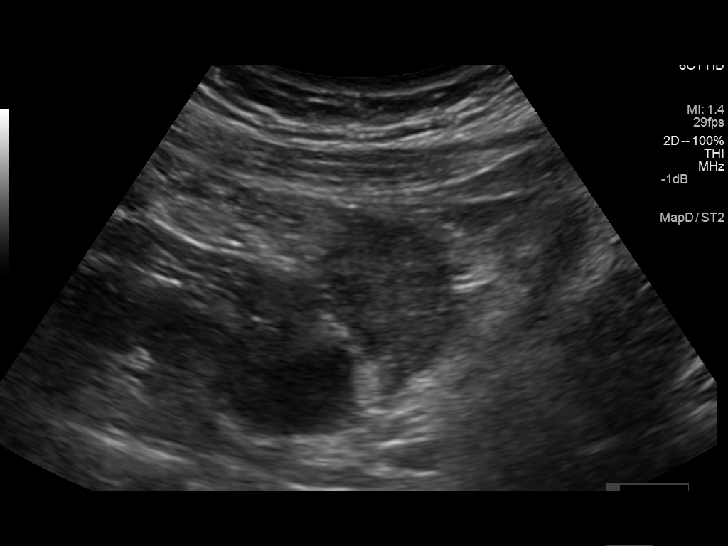
[im 28/59]
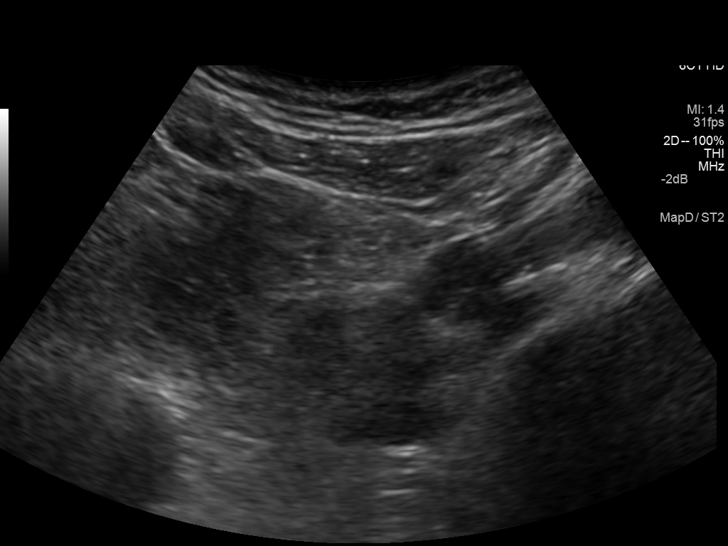
[im 33/59]
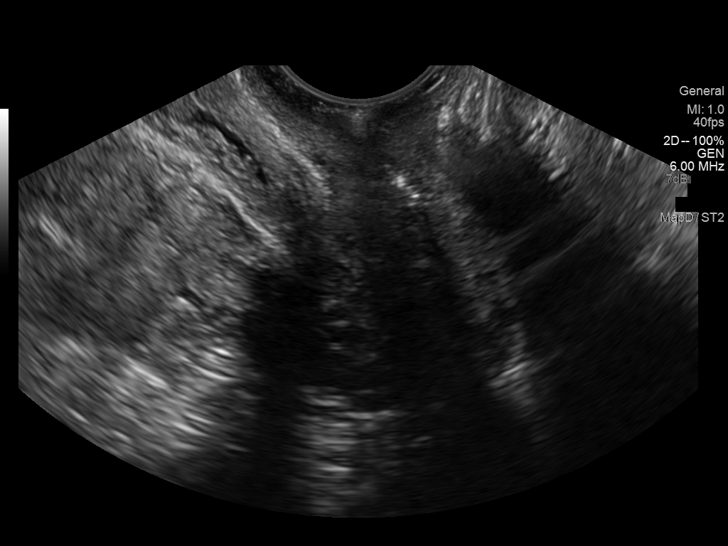
[im 37/59]
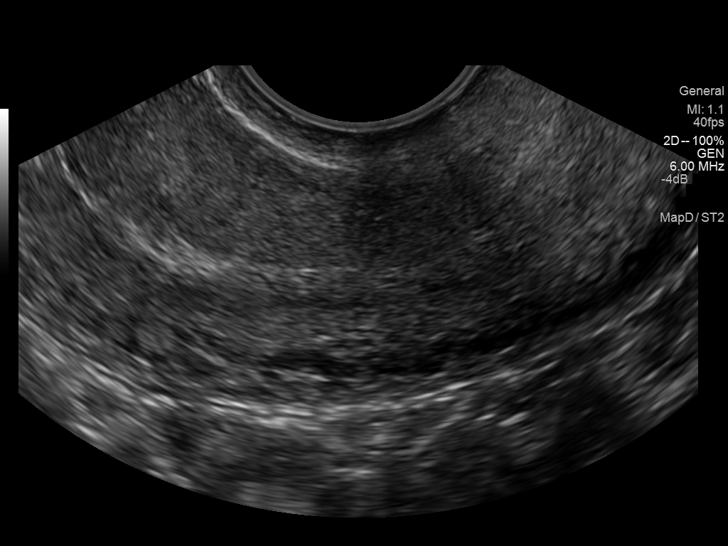
[im 41/59]
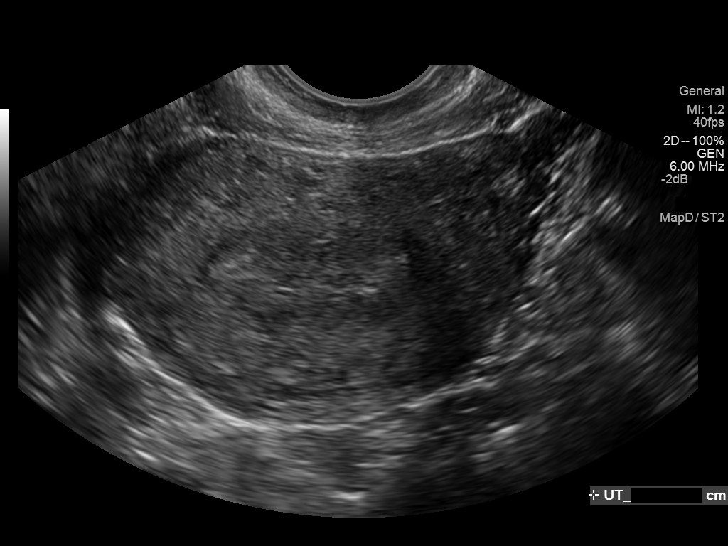
[im 46/59]
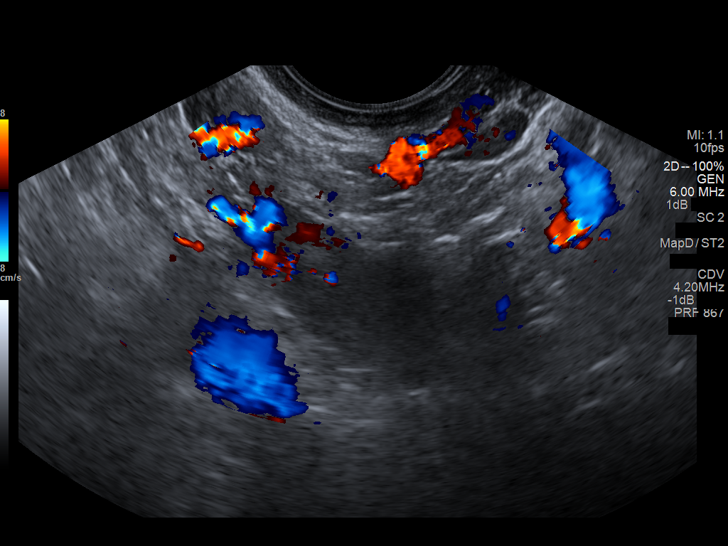
[im 50/59]
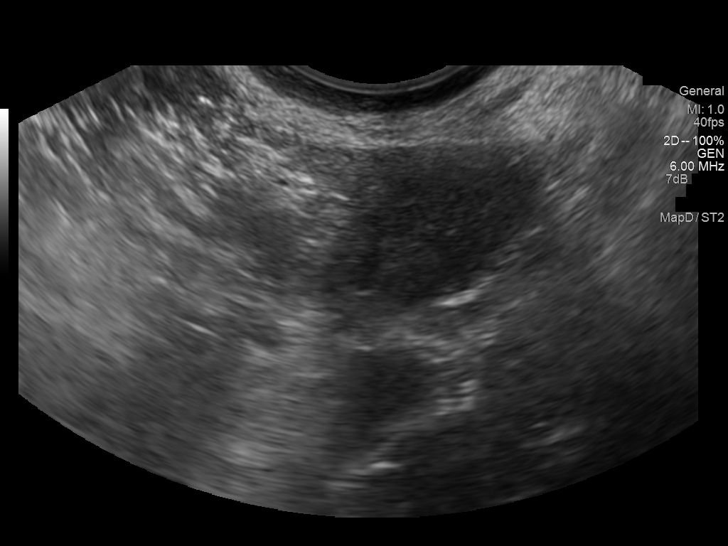
[im 54/59]
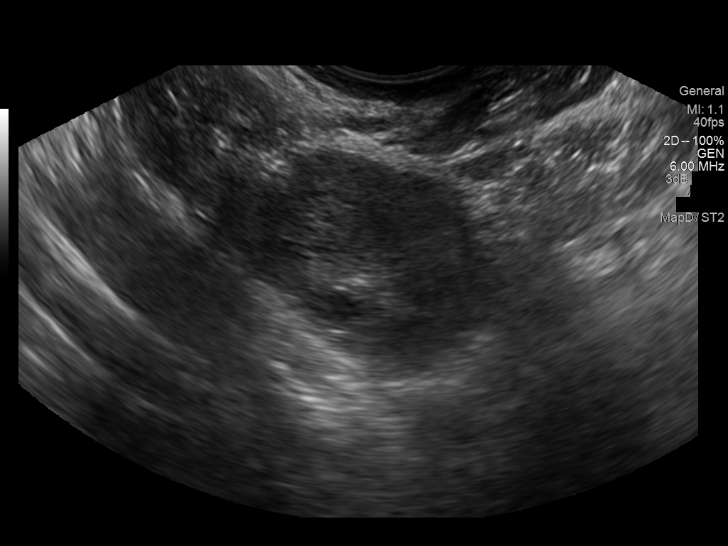
[im 59/59]
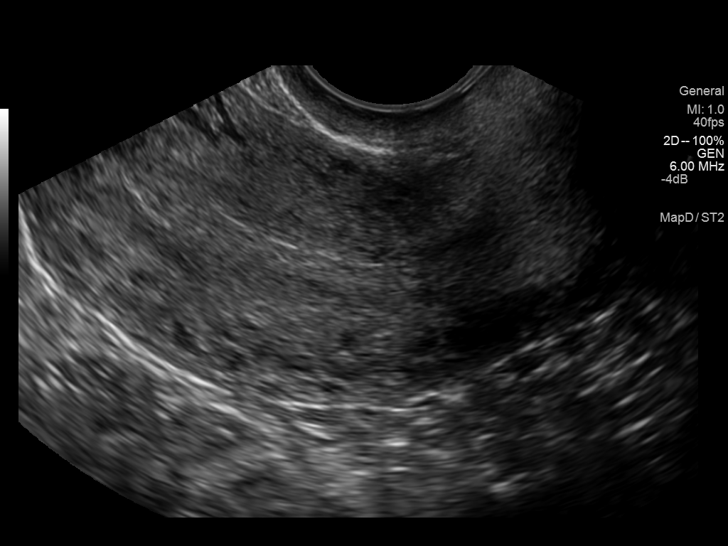

[14 of 28 positions shown; findings below may reference images not displayed]

FINDINGS: Intrauterine gestational sac: Not visualized.

Yolk sac:  Not visualized.

Embryo:  Not visualized

Maternal uterus/adnexa: Bilateral ovaries are normal in size and
echogenicity. The right ovary measures 2.6 x 2.1 x 2 cm. The left
ovary measures 3.2 x 1.7 x 1.7 cm. There is no pelvic free fluid.
The uterus is normal in size and echogenicity.
IMPRESSION: No intrauterine pregnancy identified. Given the elevated beta HCG,
differential diagnosis include missed abortion versus early
pregnancy versus ectopic pregnancy. Recommend clinical correlation,
serial quantitative beta HCGs, ectopic precautions, and followup
ultrasound as clinically indicated.
